# Patient Record
Sex: Female | Born: 1995 | Race: Black or African American | Hispanic: No | Marital: Single | Smoking: Never smoker
Health system: Southern US, Community
[De-identification: ages and names within clinical notes are randomized; demographics above are authoritative.]

## PROBLEM LIST (undated history)

## (undated) DIAGNOSIS — Z789 Other specified health status: Secondary | ICD-10-CM

---
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
## 2016-11-08 IMAGING — CR DG HUMERUS 2V *L*
1 series · 2 of 2 positions shown · non-contrast
Comparison: None.

CLINICAL DATA: Implanted contraceptive device .

EXAM:
LEFT HUMERUS - 2+ VIEW

[Series 1: w humerus ap left · 0.14mm/px · 2 of 2 slices shown]
[im 1/2]
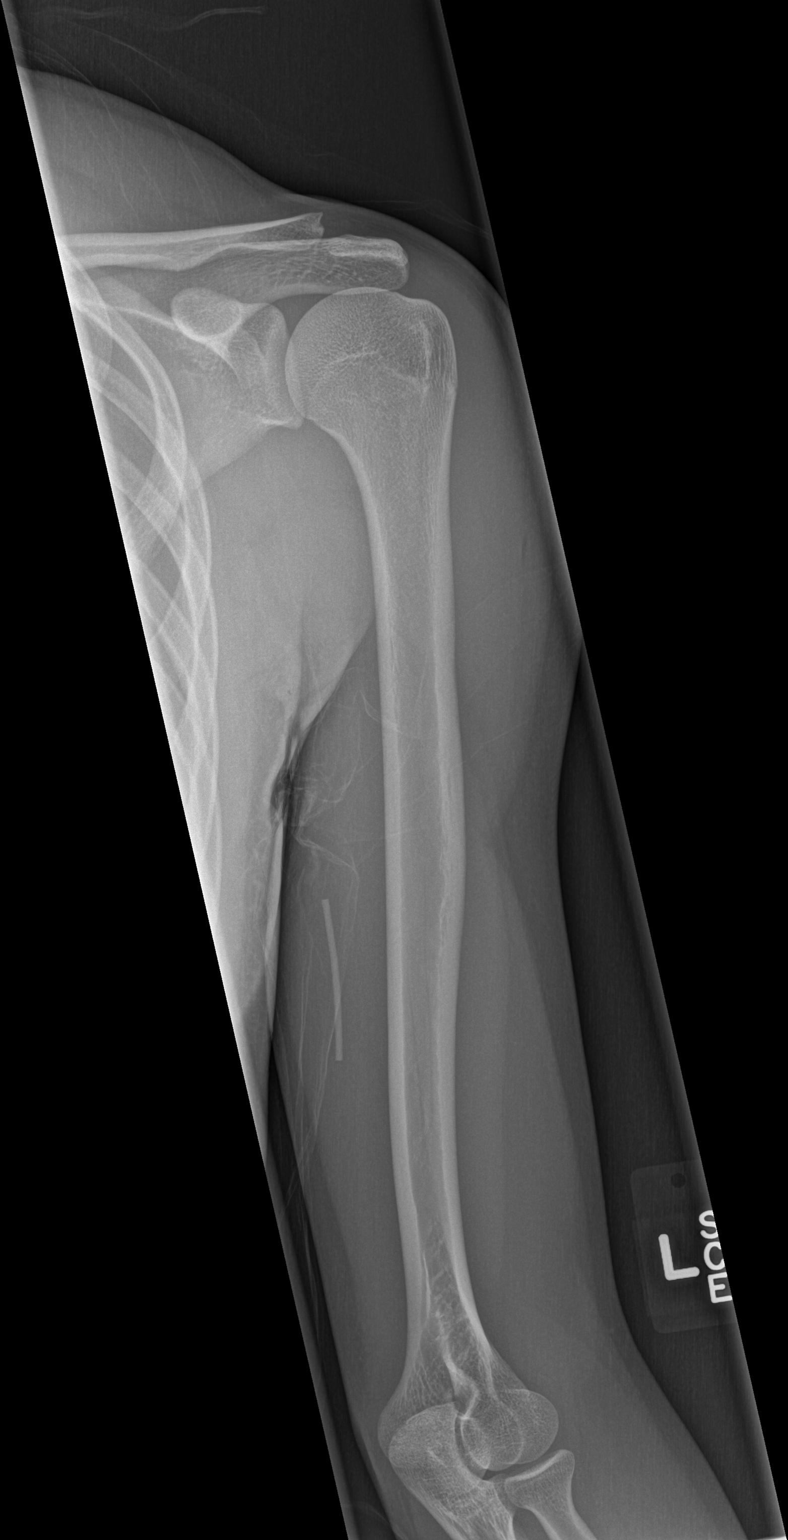
[im 2/2]
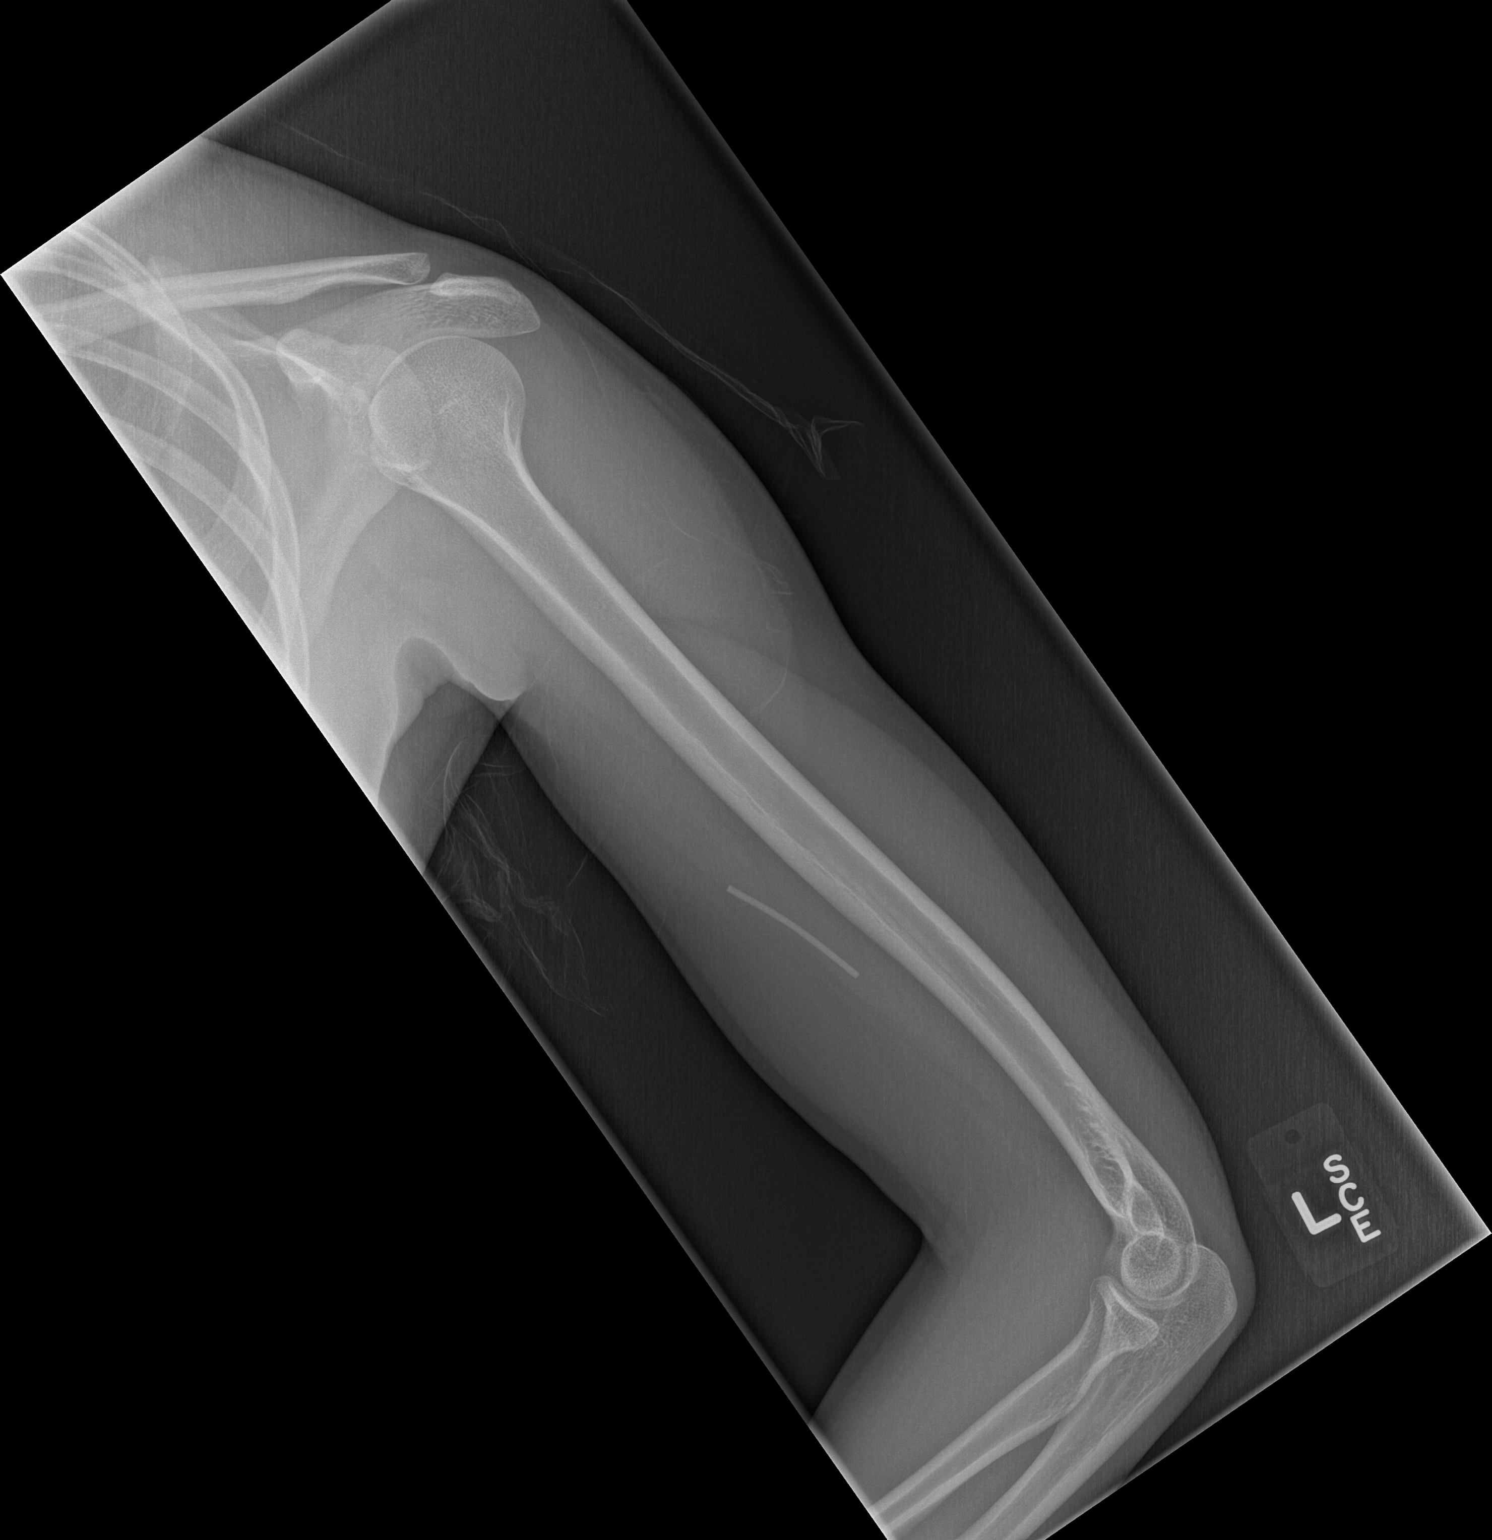

[2 of 2 positions shown; findings below may reference images not displayed]

FINDINGS: Implanted contraceptive device is noted over the anterior medial mid
left arm approximately 2 cm deep to the scan and almost parallel to
the humerus. No acute soft tissue or bony abnormality.
IMPRESSION: Implant contraceptive device noted in the anteromedial soft tissues
of left upper extremity as described above

## 2017-05-18 HISTORY — PX: OTHER SURGICAL HISTORY: SHX169

## 2020-11-21 ENCOUNTER — Encounter (HOSPITAL_COMMUNITY): Payer: Self-pay | Admitting: *Deleted

## 2020-11-21 ENCOUNTER — Inpatient Hospital Stay (HOSPITAL_COMMUNITY)
Admission: AD | Admit: 2020-11-21 | Discharge: 2020-11-21 | Disposition: A | Discharge status: Home / Self Care | Payer: Medicaid Other | Attending: Obstetrics & Gynecology | Admitting: Obstetrics & Gynecology

## 2020-11-21 DIAGNOSIS — O26851 Spotting complicating pregnancy, first trimester: Secondary | ICD-10-CM | POA: Diagnosis not present

## 2020-11-21 DIAGNOSIS — O209 Hemorrhage in early pregnancy, unspecified: Secondary | ICD-10-CM

## 2020-11-21 DIAGNOSIS — Z3A01 Less than 8 weeks gestation of pregnancy: Secondary | ICD-10-CM | POA: Insufficient documentation

## 2020-11-21 HISTORY — DX: Other specified health status: Z78.9

## 2020-11-21 LAB — URINALYSIS, ROUTINE W REFLEX MICROSCOPIC
Bilirubin Urine: NEGATIVE
Glucose, UA: NEGATIVE mg/dL
Hgb urine dipstick: NEGATIVE
Ketones, ur: NEGATIVE mg/dL
Nitrite: NEGATIVE
Protein, ur: NEGATIVE mg/dL
Specific Gravity, Urine: 1.021 (ref 1.005–1.030)
pH: 6 (ref 5.0–8.0)

## 2020-11-21 LAB — POCT PREGNANCY, URINE: Preg Test, Ur: POSITIVE — AB

## 2020-11-21 LAB — HCG, QUANTITATIVE, PREGNANCY: hCG, Beta Chain, Quant, S: 17948 m[IU]/mL — ABNORMAL HIGH (ref <5)
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
## 2020-11-21 NOTE — MAU Provider Note (Signed)
Event Date/Time   First Provider Initiated Contact with Patient 11/21/20 1848      Chief Complaint:  Vaginal Bleeding   Christie Anthony is  25 y.o. G2P0010 at [redacted]w[redacted]d presents complaining of Vaginal Bleeding .   Had some bright red spotting on tissue last night X1, none since.  No recent IC.  Labs for CHL, GC, TV all negative 7/7 from pt portal ([facility]).  US showed 5 week GS 2 days ago  Had prior SAB, so nervous.   Obstetrical/Gynecological History: OB History     Gravida  2   Para      Term      Preterm      AB  1   Living         SAB  1   IAB      Ectopic      Multiple      Live Births             Past Medical History: Past Medical History:  Diagnosis Date   Medical history non-contributory     Past Surgical History: Past Surgical History:  Procedure Laterality Date   OTHER SURGICAL HISTORY Right 2019   repair of rt tendon and hamstring with  implant    Family History: Family History  Problem Relation Age of Onset   Healthy Mother     Social History: Social History   Tobacco Use   Smoking status: Never   Smokeless tobacco: Never  Vaping Use   Vaping Use: Never used  Substance Use Topics   Alcohol use: Not Currently    Alcohol/week: 6.0 standard drinks    Types: 6 Standard drinks or equivalent per week   Drug use: Not Currently    Types: Marijuana    Comment: daily    Allergies: No Known Allergies  Meds:  No medications prior to admission.    Review of Systems   Constitutional: Negative for fever and chills Eyes: Negative for visual disturbances Respiratory: Negative for shortness of breath, dyspnea Cardiovascular: Negative for chest pain or palpitations  Gastrointestinal: Negative for vomiting, diarrhea and constipation Genitourinary: Negative for dysuria and urgency Musculoskeletal: Negative for back pain, joint pain, myalgias.  Normal ROM  Neurological: Negative for dizziness and headaches    Physical Exam  Blood pressure  132/73, pulse 99, temperature 98.4 F (36.9 C), temperature source Oral, resp. rate 17, height 5\' 3"  (1.6 m), weight 65.6 kg, last menstrual period 10/07/2020, SpO2 99 %. GENERAL: Well-developed, well-nourished female in no acute distress.  LUNGS: Normal respiratory effort HEART: Regular rate and rhythm. ABDOMEN: Soft, nontender, nondistended, gravid.  EXTREMITIES: Nontender, no edema, 2+ distal pulses. DTR's 2+ PELVIC EXAM: Scant amount of normal appearing discharge.  Tiny spot of pink blood behind cx  Labs: Results for orders placed or performed during the hospital encounter of 11/21/20 (from the past 24 hour(s))  Pregnancy, urine POC   Collection Time: 11/21/20  6:17 PM  Result Value Ref Range   Preg Test, Ur POSITIVE (A) NEGATIVE  Urinalysis, Routine w reflex microscopic Urine, Clean Catch   Collection Time: 11/21/20  6:40 PM  Result Value Ref Range   Color, Urine YELLOW YELLOW   APPearance CLOUDY (A) CLEAR   Specific Gravity, Urine 1.021 1.005 - 1.030   pH 6.0 5.0 - 8.0   Glucose, UA NEGATIVE NEGATIVE mg/dL   Hgb urine dipstick NEGATIVE NEGATIVE   Bilirubin Urine NEGATIVE NEGATIVE   Ketones, ur NEGATIVE NEGATIVE mg/dL   Protein, ur NEGATIVE  NEGATIVE mg/dL   Nitrite NEGATIVE NEGATIVE   Leukocytes,Ua SMALL (A) NEGATIVE   RBC / HPF 0-5 0 - 5 RBC/hpf   WBC, UA 0-5 0 - 5 WBC/hpf   Bacteria, UA RARE (A) NONE SEEN   Squamous Epithelial / LPF 21-50 0 - 5   Mucus PRESENT   hCG, quantitative, pregnancy   Collection Time: 11/21/20  7:06 PM  Result Value Ref Range   hCG, Beta Chain, Quant, S 17,948 (H) <5 mIU/mL   Imaging Studies:  No results found.  Assessment: Christie Anthony is  25 y.o. G2P0010 at [redacted]w[redacted]d presents with 1st trimester spotting.  Plan: [facility] today and repeat in 48 hours  Repeat US already set up at [facility] 7/7/20229:36 PM

## 2020-11-25 ENCOUNTER — Telehealth: Payer: Self-pay | Admitting: Advanced Practice Midwife

## 2020-11-25 NOTE — Telephone Encounter (Signed)
Patient was expected to present to MAU for repeat lab work on 11/23/2020.  Attempted to reach patient to discuss indication for repeat Stat Quant hCG.   Phone listed as home phone is reportedly patient's mother's number. They do not live together and patient is not permitted to leave messages with the person answering the phone. She instructed me to call [phone] and states this is the patient's actual cell phone number. Attempted to reach patient at this number but mail box was full, unable to leave message.  Clayton Bibles, MSN, CNM Certified Nurse Midwife, [employer] for [employer], [facility] 11/25/20 1:30 PM

## 2021-07-18 ENCOUNTER — Encounter: Payer: Self-pay | Admitting: Emergency Medicine

## 2021-07-18 ENCOUNTER — Other Ambulatory Visit: Payer: Self-pay

## 2021-07-18 DIAGNOSIS — O9A23 Injury, poisoning and certain other consequences of external causes complicating the puerperium: Secondary | ICD-10-CM | POA: Diagnosis not present

## 2021-07-18 DIAGNOSIS — S61412A Laceration without foreign body of left hand, initial encounter: Secondary | ICD-10-CM | POA: Insufficient documentation

## 2021-07-18 DIAGNOSIS — S61452A Open bite of left hand, initial encounter: Secondary | ICD-10-CM | POA: Insufficient documentation

## 2021-07-18 DIAGNOSIS — W540XXA Bitten by dog, initial encounter: Secondary | ICD-10-CM | POA: Insufficient documentation

## 2021-07-18 NOTE — ED Triage Notes (Addendum)
Pt arrived via POV with reports of dog bite to L hand. Dog is up to date on shots, last tetanus was 12/9, pt had recent pregnancy and is 2 weeks post partum. ? ?Bleeding controlled with bandaid ?

## 2021-07-18 NOTE — ED Notes (Signed)
Wound cleansed with normal saline and wrapped in gauze.  ?

## 2021-07-19 ENCOUNTER — Emergency Department: Payer: Medicaid Other

## 2021-07-19 ENCOUNTER — Emergency Department
Admission: EM | Admit: 2021-07-19 | Discharge: 2021-07-19 | Disposition: A | Discharge status: Home / Self Care | Payer: Medicaid Other | Attending: Emergency Medicine | Admitting: Emergency Medicine

## 2021-07-19 DIAGNOSIS — S61452A Open bite of left hand, initial encounter: Secondary | ICD-10-CM

## 2021-07-19 MED ORDER — AMOXICILLIN-POT CLAVULANATE 875-125 MG PO TABS
1.0000 | ORAL_TABLET | Freq: Once | ORAL | Status: AC
  Administered 2021-07-19: 1 via ORAL

## 2021-07-19 MED ORDER — AMOXICILLIN-POT CLAVULANATE 875-125 MG PO TABS: 1.0000 | ORAL_TABLET | Freq: Two times a day (BID) | ORAL | 0 refills | Status: AC

## 2021-07-19 NOTE — ED Provider Notes (Signed)
? ?[facility] ?Provider Note ? ? ? Event Date/Time  ? First MD Initiated Contact with Patient 07/19/21 [phone]   ?  (approximate) ? ? ?History  ? ?Animal Bite ? ? ?HPI ? ?Christie Anthony is a 26 y.o. female with no significant past medical history who presents for evaluation of dog bite to the hand.  Patient was bitten by her own dog whose vaccines are up-to-date.  Last tetanus shot December 2022.  She has a small laceration on the left palm.  She is complaining of minimal throbbing pain. ?  ? ? ?Past Medical History:  ?Diagnosis Date  ? Medical history non-contributory   ? ? ?Past Surgical History:  ?Procedure Laterality Date  ? OTHER SURGICAL HISTORY Right 2019  ? repair of rt tendon and hamstring with  implant  ? ? ? ?Physical Exam  ? ?Triage Vital Signs: ?ED Triage Vitals  ?Enc Vitals Group  ?   BP 07/18/21 2337 (!) 141/89  ?   Pulse Rate 07/18/21 2337 92  ?   Resp 07/18/21 2337 18  ?   Temp 07/18/21 2337 97.9 ?F (36.6 ?C)  ?   Temp Source 07/18/21 2337 Oral  ?   SpO2 07/18/21 2337 96 %  ?   Weight 07/18/21 2335 153 lb (69.4 kg)  ?   Height 07/18/21 2335 5\' 3"  (1.6 m)  ?   Head Circumference --   ?   Peak Flow --   ?   Pain Score 07/18/21 2335 4  ?   Pain Loc --   ?   Pain Edu? --   ?   Excl. in GC? --   ? ? ?Most recent vital signs: ?Vitals:  ? 07/18/21 2337 07/19/21 0217  ?BP: (!) 141/89 135/73  ?Pulse: 92 93  ?Resp: 18   ?Temp: 97.9 ?F (36.6 ?C)   ?SpO2: 96% 97%  ? ? ? ?Constitutional: Alert and oriented. Well appearing and in no apparent distress. ?HEENT: ?     Head: Normocephalic and atraumatic.    ?     Eyes: Conjunctivae are normal. Sclera is non-icteric.  ?     Mouth/Throat: Mucous membranes are moist.  ?     Neck: Supple with no signs of meningismus. ?Cardiovascular: Regular rate and rhythm.   ?Respiratory: Normal respiratory effort.  ?Musculoskeletal:  1 cm laceration to the L palm  ?Neurologic: Normal speech and language. Face is symmetric. Moving all extremities. No gross focal  neurologic deficits are appreciated. ?Skin: Skin is warm, dry and intact. No rash noted. ?Psychiatric: Mood and affect are normal. Speech and behavior are normal. ? ?ED Results / Procedures / Treatments  ? ?Labs ?(all labs ordered are listed, but only abnormal results are displayed) ?Labs Reviewed - No data to display ? ? ?EKG ? ?none ? ? ?RADIOLOGY ?I, 09/18/21, attending MD, have personally viewed and interpreted the images obtained during this visit as below: ? ?X-ray with no fracture or foreign body ? ? ?___________________________________________________ ?Interpretation by Radiologist:  ?DG Hand Complete Left ? ?Result Date: 07/19/2021 ?CLINICAL DATA:  Dog bite. EXAM: LEFT HAND - COMPLETE 3+ VIEW COMPARISON:  None. FINDINGS: There is no evidence of fracture or dislocation. There is no evidence of arthropathy or other focal bone abnormality. A dressing is in place overlying the thumb, minimal soft tissue edema seen about the radial aspect of the thumb. No radiopaque foreign body. IMPRESSION: Minimal soft tissue edema. No radiopaque foreign body or osseous abnormality. Electronically Signed  By: Narda Rutherford M.D.   On: 07/19/2021 00:22   ? ? ? ?PROCEDURES: ? ?Critical Care performed: No ? ?Procedures ? ? ? ?IMPRESSION / MDM / ASSESSMENT AND PLAN / ED COURSE  ?I reviewed the triage vital signs and the nursing notes. ? ?26 y.o. female with no significant past medical history who presents for evaluation of dog bite to the hand.  Patient with a shallow laceration to the palm of the hand.  No indication for stitching especially since it happened 5 hours ago.  We will let it heal by secondary intent.  Wound was thoroughly sterilized with diluted Betadine.  Patient was started on a 7-day course of Augmentin.  Discussed wound care and my standard return precautions for any signs of infection.  Tetanus shot is up-to-date.  Dog's vaccinations also up-to-date. ? ?MEDICATIONS GIVEN IN ED: ?Medications   ?amoxicillin-clavulanate (AUGMENTIN) 875-125 MG per tablet 1 tablet (has no administration in time range)  ? ? ?EMR reviewed including last visit with her primary care doctor for pregnancy for which she delivered 2 weeks ago ? ? ? ?FINAL CLINICAL IMPRESSION(S) / ED DIAGNOSES  ? ?Final diagnoses:  ?Dog bite of left hand, initial encounter  ? ? ? ?Rx / DC Orders  ? ?ED Discharge Orders   ? ?      Ordered  ?  amoxicillin-clavulanate (AUGMENTIN) 875-125 MG tablet  2 times daily       ? 07/19/21 0415  ? ?  ?  ? ?  ? ? ? ?Note:  This document was prepared using Dragon voice recognition software and may include unintentional dictation errors. ? ? ?Please note:  Patient was evaluated in Emergency Department today for the symptoms described in the history of present illness. Patient was evaluated in the context of the global COVID-19 pandemic, which necessitated consideration that the patient might be at risk for infection with the SARS-CoV-2 virus that causes COVID-19. Institutional protocols and algorithms that pertain to the evaluation of patients at risk for COVID-19 are in a state of rapid change based on information released by regulatory bodies including the CDC and federal and state organizations. These policies and algorithms were followed during the patient's care in the ED.  Some ED evaluations and interventions may be delayed as a result of limited staffing during the pandemic. ? ? ? ? ?  ?Nita Sickle, MD ?07/19/21 [phone] ? ?

## 2021-07-19 NOTE — ED Notes (Signed)
Wound dressed. ?

## 2021-07-19 NOTE — ED Notes (Signed)
[place] [employer] here to speak with pt ?

## 2021-07-19 NOTE — Discharge Instructions (Addendum)
Take antibiotics as prescribed.  Keep your hand dry and clean.  It is okay to wash it several times a day with warm water and soap.  Wrap it if you are getting get your hands dirty.  Watch for any signs of infection which include pus, redness or warmth of the skin surrounding the cut or fever.  If these develop please return to the hospital. ?

## 2021-07-19 NOTE — ED Notes (Signed)
Pt soaking laceration in diluted betadine solution.  ?

## 2022-03-06 ENCOUNTER — Emergency Department: Payer: Medicaid Other

## 2022-03-06 ENCOUNTER — Encounter: Payer: Self-pay | Admitting: Emergency Medicine

## 2022-03-06 ENCOUNTER — Other Ambulatory Visit: Payer: Self-pay

## 2022-03-06 ENCOUNTER — Emergency Department
Admission: EM | Admit: 2022-03-06 | Discharge: 2022-03-06 | Disposition: A | Discharge status: Home / Self Care | Payer: Medicaid Other | Attending: Emergency Medicine | Admitting: Emergency Medicine

## 2022-03-06 DIAGNOSIS — S0232XA Fracture of orbital floor, left side, initial encounter for closed fracture: Secondary | ICD-10-CM | POA: Insufficient documentation

## 2022-03-06 DIAGNOSIS — W19XXXA Unspecified fall, initial encounter: Secondary | ICD-10-CM | POA: Diagnosis not present

## 2022-03-06 DIAGNOSIS — F10929 Alcohol use, unspecified with intoxication, unspecified: Secondary | ICD-10-CM

## 2022-03-06 DIAGNOSIS — Y92009 Unspecified place in unspecified non-institutional (private) residence as the place of occurrence of the external cause: Secondary | ICD-10-CM | POA: Insufficient documentation

## 2022-03-06 DIAGNOSIS — N9489 Other specified conditions associated with female genital organs and menstrual cycle: Secondary | ICD-10-CM | POA: Insufficient documentation

## 2022-03-06 DIAGNOSIS — S0512XA Contusion of eyeball and orbital tissues, left eye, initial encounter: Secondary | ICD-10-CM

## 2022-03-06 DIAGNOSIS — F10920 Alcohol use, unspecified with intoxication, uncomplicated: Secondary | ICD-10-CM | POA: Diagnosis not present

## 2022-03-06 DIAGNOSIS — S0592XA Unspecified injury of left eye and orbit, initial encounter: Secondary | ICD-10-CM | POA: Diagnosis present

## 2022-03-06 LAB — CBC
HCT: 42.4 % (ref 36.0–46.0)
Hemoglobin: 13.8 g/dL (ref 12.0–15.0)
MCH: 30.9 pg (ref 26.0–34.0)
MCHC: 32.5 g/dL (ref 30.0–36.0)
MCV: 95.1 fL (ref 80.0–100.0)
Platelets: 364 10*3/uL (ref 150–400)
RBC: 4.46 MIL/uL (ref 3.87–5.11)
RDW: 11.9 % (ref 11.5–15.5)
WBC: 9.8 10*3/uL (ref 4.0–10.5)
nRBC: 0 % (ref 0.0–0.2)

## 2022-03-06 LAB — BASIC METABOLIC PANEL
Anion gap: 10 (ref 5–15)
BUN: 11 mg/dL (ref 6–20)
CO2: 21 mmol/L — ABNORMAL LOW (ref 22–32)
Calcium: 8.8 mg/dL — ABNORMAL LOW (ref 8.9–10.3)
Chloride: 111 mmol/L (ref 98–111)
Creatinine, Ser: 0.88 mg/dL (ref 0.44–1.00)
GFR, Estimated: >60 mL/min (ref >60)
Glucose, Bld: 66 mg/dL — ABNORMAL LOW (ref 70–99)
Potassium: 3.6 mmol/L (ref 3.5–5.1)
Sodium: 142 mmol/L (ref 135–145)

## 2022-03-06 LAB — HCG, QUANTITATIVE, PREGNANCY: hCG, Beta Chain, Quant, S: 1 m[IU]/mL (ref <5)

## 2022-03-06 LAB — ETHANOL: Alcohol, Ethyl (B): 210 mg/dL — ABNORMAL HIGH (ref <10)

## 2022-03-06 NOTE — Discharge Instructions (Signed)
As we discussed, your evaluation was generally reassuring.  However you did sustain a fracture to the facial bone below your eye (an orbital floor fracture).  Sometimes the muscles that move your eyeball can get stuck in the fracture.  He although that is not happening currently, if you develop double vision or increased pain when you try to look around in different directions, you should return immediately to the emergency department.  Otherwise you can call first thing in the morning to schedule a follow-up appointment for next week with Dr. Richardson Landry or one of his colleagues at the ENT clinic.  They will likely see you next week and can talk with you about whether or not anything else needs to be done to help your fracture heal.  In the meantime, please use ice packs on the area to help with the swelling.  You will likely remain bruised for an extended period of time.  You can use over-the-counter ibuprofen and/or Tylenol as needed for pain.
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
## 2022-07-11 ENCOUNTER — Emergency Department: Payer: Medicaid Other

## 2022-07-11 ENCOUNTER — Other Ambulatory Visit: Payer: Self-pay

## 2022-07-11 ENCOUNTER — Emergency Department
Admission: EM | Admit: 2022-07-11 | Discharge: 2022-07-12 | Disposition: A | Discharge status: Home / Self Care | Payer: Medicaid Other | Attending: Emergency Medicine | Admitting: Emergency Medicine

## 2022-07-11 DIAGNOSIS — Z3A01 Less than 8 weeks gestation of pregnancy: Secondary | ICD-10-CM | POA: Insufficient documentation

## 2022-07-11 DIAGNOSIS — O418X10 Other specified disorders of amniotic fluid and membranes, first trimester, not applicable or unspecified: Secondary | ICD-10-CM | POA: Diagnosis not present

## 2022-07-11 DIAGNOSIS — O209 Hemorrhage in early pregnancy, unspecified: Secondary | ICD-10-CM | POA: Diagnosis present

## 2022-07-11 DIAGNOSIS — O4690 Antepartum hemorrhage, unspecified, unspecified trimester: Secondary | ICD-10-CM

## 2022-07-11 LAB — BASIC METABOLIC PANEL
Anion gap: 8 (ref 5–15)
BUN: 11 mg/dL (ref 6–20)
CO2: 20 mmol/L — ABNORMAL LOW (ref 22–32)
Calcium: 9.2 mg/dL (ref 8.9–10.3)
Chloride: 106 mmol/L (ref 98–111)
Creatinine, Ser: 1.04 mg/dL — ABNORMAL HIGH (ref 0.44–1.00)
GFR, Estimated: >60 mL/min (ref >60)
Glucose, Bld: 117 mg/dL — ABNORMAL HIGH (ref 70–99)
Potassium: 3.7 mmol/L (ref 3.5–5.1)
Sodium: 134 mmol/L — ABNORMAL LOW (ref 135–145)

## 2022-07-11 LAB — CBC
HCT: 40.8 % (ref 36.0–46.0)
Hemoglobin: 13.8 g/dL (ref 12.0–15.0)
MCH: 31.5 pg (ref 26.0–34.0)
MCHC: 33.8 g/dL (ref 30.0–36.0)
MCV: 93.2 fL (ref 80.0–100.0)
Platelets: 352 10*3/uL (ref 150–400)
RBC: 4.38 MIL/uL (ref 3.87–5.11)
RDW: 11.5 % (ref 11.5–15.5)
WBC: 8.3 10*3/uL (ref 4.0–10.5)
nRBC: 0 % (ref 0.0–0.2)

## 2022-07-11 LAB — URINALYSIS, ROUTINE W REFLEX MICROSCOPIC
Bacteria, UA: NONE SEEN
Bilirubin Urine: NEGATIVE
Glucose, UA: NEGATIVE mg/dL
Hgb urine dipstick: NEGATIVE
Ketones, ur: NEGATIVE mg/dL
Nitrite: NEGATIVE
Protein, ur: 30 mg/dL — AB
Specific Gravity, Urine: 1.028 (ref 1.005–1.030)
WBC, UA: NONE SEEN WBC/hpf (ref 0–5)
pH: 5 (ref 5.0–8.0)

## 2022-07-11 LAB — POC URINE PREG, ED: Preg Test, Ur: POSITIVE — AB

## 2022-07-11 LAB — HCG, QUANTITATIVE, PREGNANCY: hCG, Beta Chain, Quant, S: 25375 m[IU]/mL — ABNORMAL HIGH (ref <5)

## 2022-07-11 LAB — ABO/RH: ABO/RH(D): O POS

## 2022-07-11 NOTE — Discharge Instructions (Addendum)
Single intrauterine gestation with heart rate of 171 beats per  minute and crown-rump length of 2.1 mm corresponding with a  gestational age of [redacted] weeks 6 days.    Small amount of subchorionic hemorrhage encompassing less than 25%  of the gestational sac and of questionable significance. Follow-up  as clinically appropriate.

## 2022-07-11 NOTE — ED Triage Notes (Signed)
Pt to ED from home for vaginal bleeding. Pt was able to go back out and park her car after she checked in. Pt is CAOx4 and in no acute distress and ambulatory in triage. Pt states she is pregnant but has not been confirmed at [facility] yet. G2P1 . Pt noticed some "scant blood" in her panties and wanted it checked out.

## 2022-07-11 NOTE — ED Provider Notes (Addendum)
[facility] Provider Note    Event Date/Time   First MD Initiated Contact with Patient 07/11/22 2150     (approximate)   History   Vaginal Bleeding   HPI  Christie Anthony is a 27 y.o. female third pregnancy who comes in with concerns for vaginal bleeding.  Patient reports that she is about 7 to [redacted] weeks pregnant.  She reports a little bit of bright red blood on the toilet paper and she was concerned about some vaginal bleeding.  She states that she has had 2 prior pregnancies 1 miscarriage 2 years ago and a live term birth about 1 year ago.  She is with 1 partner her husband and denies any new sexual partner since that prior pregnancy or concerns for STDs.  She denies any significant abdominal pain.   Physical Exam   Triage Vital Signs: ED Triage Vitals  Enc Vitals Group     BP 07/11/22 2147 (!) 148/94     Pulse Rate 07/11/22 2147 (!) 104     Resp 07/11/22 2147 16     Temp 07/11/22 2147 98.8 F (37.1 C)     Temp src --      SpO2 07/11/22 2147 98 %     Weight 07/11/22 2144 145 lb (65.8 kg)     Height 07/11/22 2144 '5\' 2"'$  (1.575 m)     Head Circumference --      Peak Flow --      Pain Score 07/11/22 2144 0     Pain Loc --      Pain Edu? --      Excl. in [place]? --     Most recent vital signs: Vitals:   07/11/22 2147  BP: (!) 148/94  Pulse: (!) 104  Resp: 16  Temp: 98.8 F (37.1 C)  SpO2: 98%     General: Awake, no distress.  CV:  Good peripheral perfusion.  Resp:  Normal effort.  Abd:  No distention.  Soft and nontender Other:     ED Results / Procedures / Treatments   Labs (all labs ordered are listed, but only abnormal results are displayed) Labs Reviewed  POC URINE PREG, ED - Abnormal; Notable for the following components:      Result Value   Preg Test, Ur POSITIVE (*)    All other components within normal limits  CBC  BASIC METABOLIC PANEL  URINALYSIS, ROUTINE W REFLEX MICROSCOPIC  HCG, QUANTITATIVE, PREGNANCY  ABO/RH        RADIOLOGY I have reviewed the ultrasound personally and interpreted and patient has a fetal pole   PROCEDURES:  Critical Care performed: No  Procedures   MEDICATIONS ORDERED IN ED: Medications - No data to display   IMPRESSION / MDM / [place] / ED COURSE  I reviewed the triage vital signs and the nursing notes.   Patient's presentation is most consistent with acute presentation with potential threat to life or bodily function.  Differential is ectopic pregnancy, miscarriage, threatened miscarriage.  Discussed pelvic exam but opted to decline given no concerns for STD no bleeding more than a pad an hour.  Preg test positive CBC normal BMP CR slightly elevated UA negative does have large leuk we will send for urine culture but negative WBCs and bacteria. HCG 25K O positive does not need rhogam   Single intrauterine gestation with heart rate of 171 beats per  minute and crown-rump length of 2.1 mm corresponding with a  gestational  age of [redacted] weeks 6 days.    Small amount of subchorionic hemorrhage encompassing less than 25%  of the gestational sac and of questionable significance. Follow-up  as clinically appropriate.    Discussed with patient the above results and she expressed understanding.  She will follow-up with her OB/GYN and return to the ER she develops worsening symptoms or any other concerns  Denies any urinary symptoms.  Urine sent for urine culture.  The patient is on the cardiac monitor to evaluate for evidence of arrhythmia and/or significant heart rate changes.      FINAL CLINICAL IMPRESSION(S) / ED DIAGNOSES   Final diagnoses:  Vaginal bleeding in pregnancy  Subchorionic hematoma in first trimester, single or unspecified fetus     Rx / DC Orders   ED Discharge Orders     None        Note:  This document was prepared using Dragon voice recognition software and may include unintentional dictation errors.   Vanessa [place], MD 07/11/22 [id number]    Vanessa , MD 07/11/22 [id number]

## 2022-07-13 LAB — URINE CULTURE

## 2024-02-28 IMAGING — CR DG HAND COMPLETE 3+V*L*
1 series · 3 of 3 positions shown · non-contrast
Comparison: None.

CLINICAL DATA: Dog bite.

EXAM:
LEFT HAND - COMPLETE 3+ VIEW

[Series 1: dg hand complete left · 0.14mm/px · 3 of 3 slices shown]
[im 1/3]
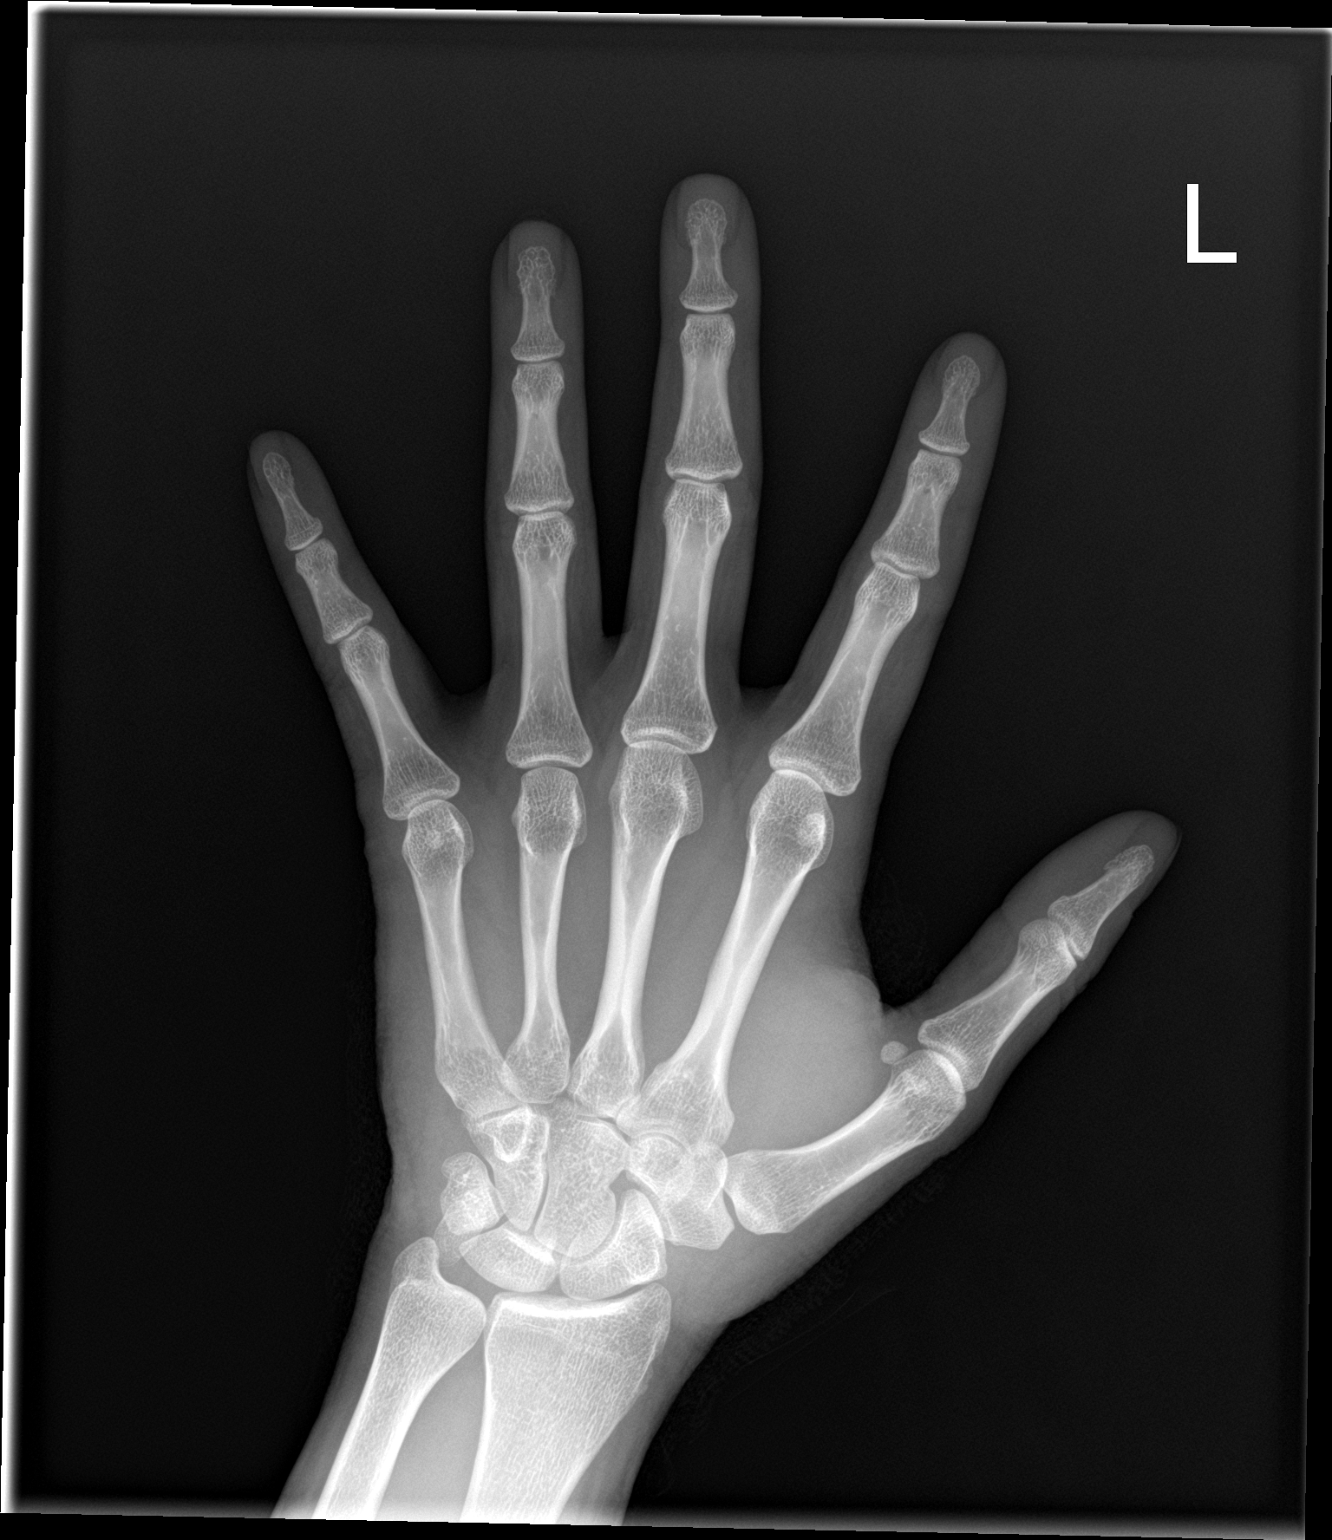
[im 2/3]
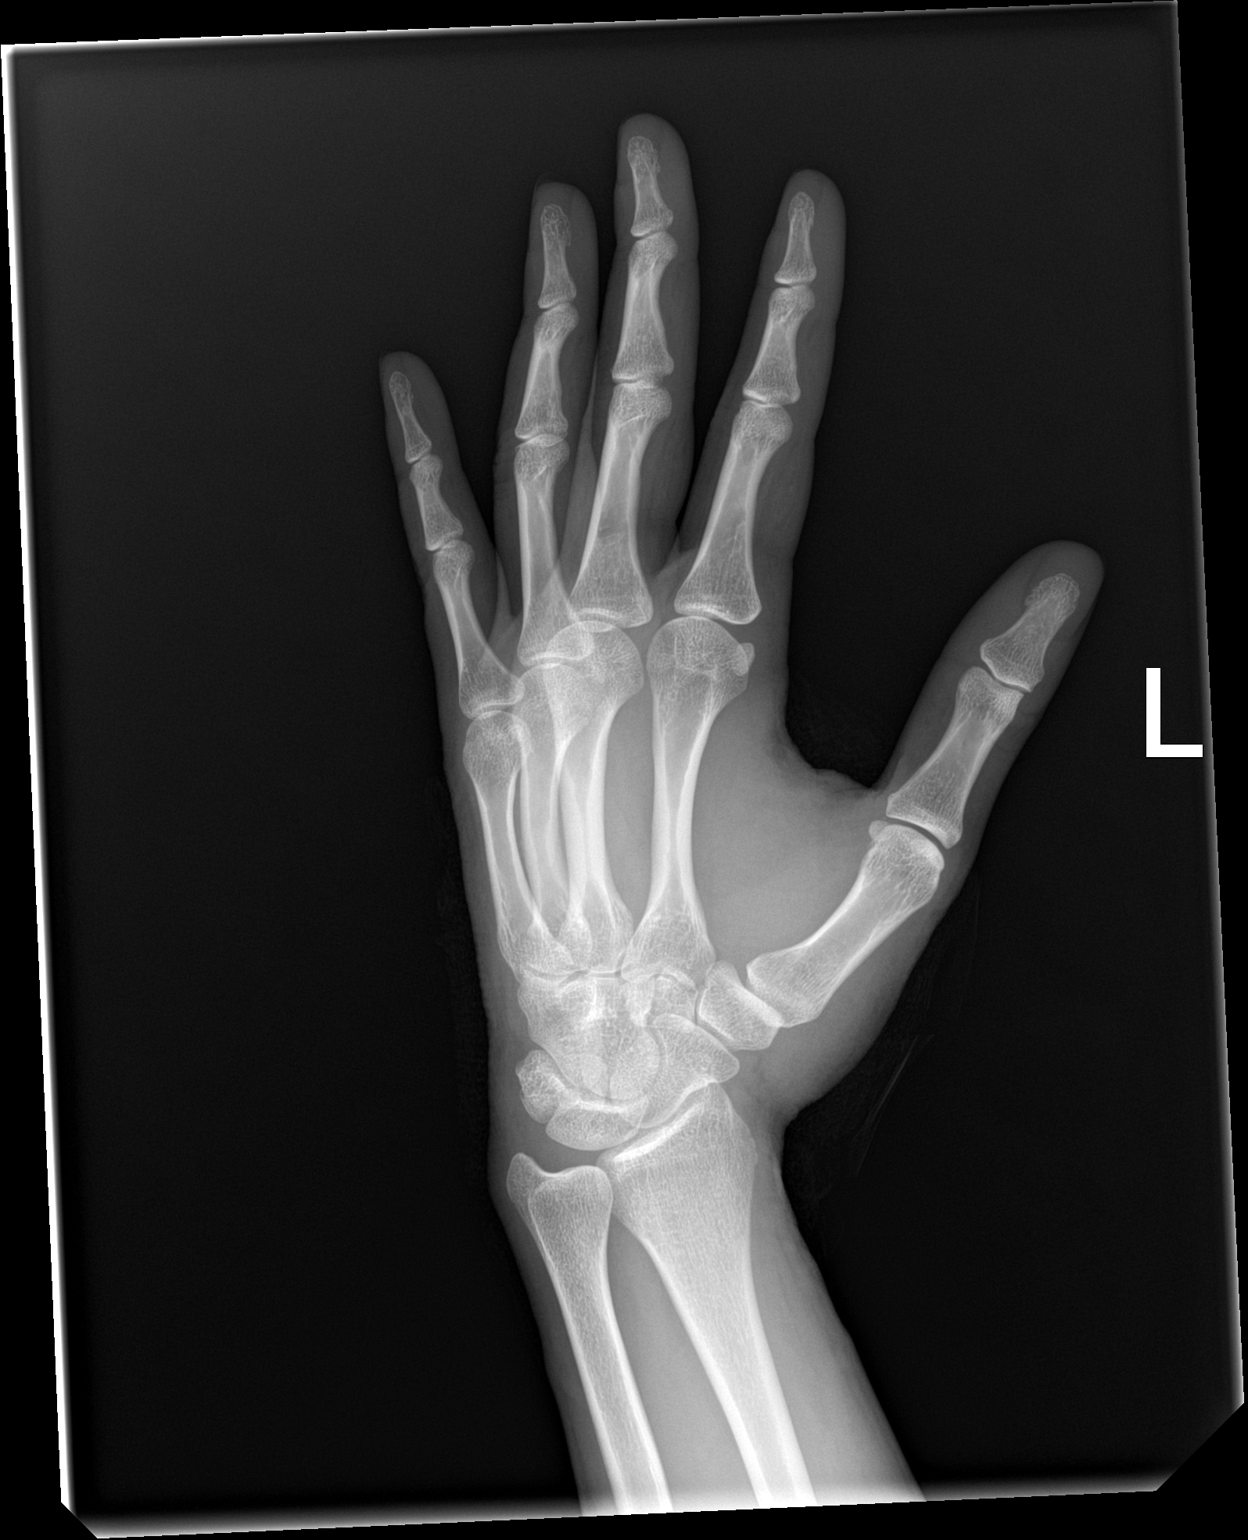
[im 3/3]
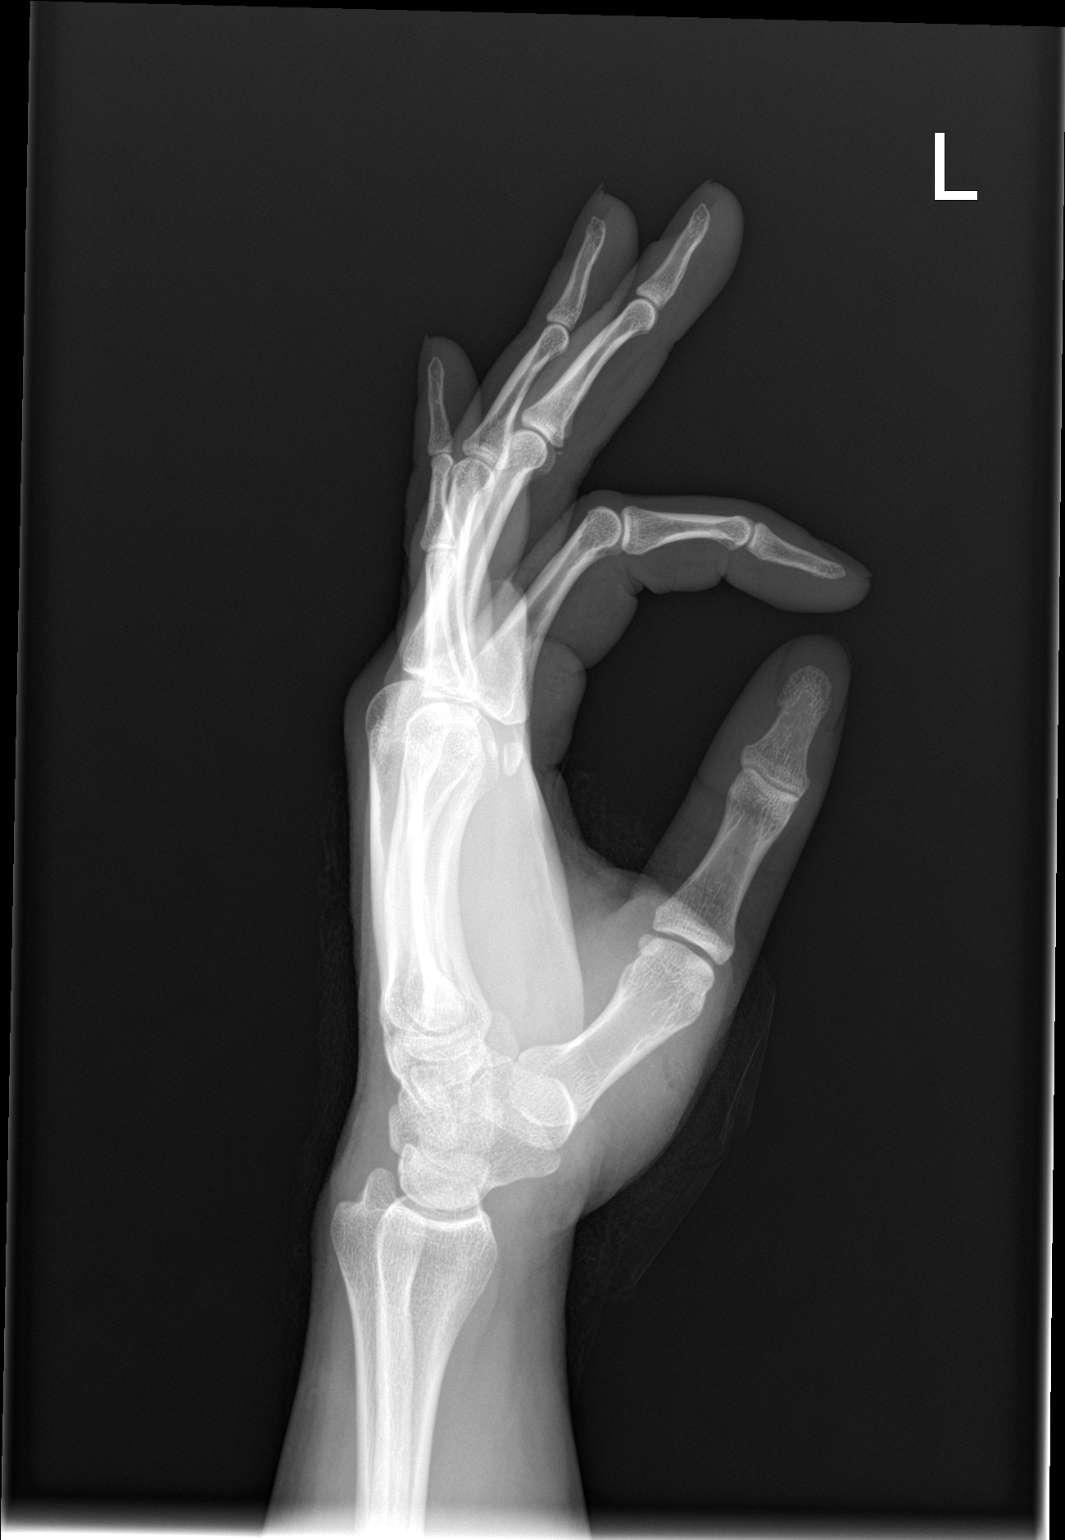

[3 of 3 positions shown; findings below may reference images not displayed]

FINDINGS: There is no evidence of fracture or dislocation. There is no
evidence of arthropathy or other focal bone abnormality. A dressing
is in place overlying the thumb, minimal soft tissue edema seen
about the radial aspect of the thumb. No radiopaque foreign body.
IMPRESSION: Minimal soft tissue edema. No radiopaque foreign body or osseous
abnormality.
